# Patient Record
Sex: Female | Born: 1975 | Hispanic: No | Smoking: Never smoker
Health system: Southern US, Community
[De-identification: ages and names within clinical notes are randomized; demographics above are authoritative.]

## PROBLEM LIST (undated history)

## (undated) DIAGNOSIS — O09529 Supervision of elderly multigravida, unspecified trimester: Secondary | ICD-10-CM

## (undated) DIAGNOSIS — Z789 Other specified health status: Secondary | ICD-10-CM

## (undated) HISTORY — DX: Supervision of elderly multigravida, unspecified trimester: O09.529

## (undated) HISTORY — DX: Other specified health status: Z78.9

## (undated) HISTORY — PX: CHOLECYSTECTOMY: SHX55

---

## 1998-09-18 ENCOUNTER — Ambulatory Visit (HOSPITAL_COMMUNITY): Admission: RE | Admit: 1998-09-18 | Discharge: 1998-09-18 | Payer: Self-pay

## 1999-10-01 ENCOUNTER — Emergency Department (HOSPITAL_COMMUNITY): Admission: EM | Admit: 1999-10-01 | Discharge: 1999-10-01 | Payer: Self-pay | Admitting: Emergency Medicine

## 1999-10-02 ENCOUNTER — Encounter: Payer: Self-pay | Admitting: Emergency Medicine

## 1999-10-16 ENCOUNTER — Observation Stay (HOSPITAL_COMMUNITY): Admission: RE | Admit: 1999-10-16 | Discharge: 1999-10-17 | Payer: Self-pay | Admitting: Surgery

## 2001-09-07 ENCOUNTER — Other Ambulatory Visit: Admission: RE | Admit: 2001-09-07 | Discharge: 2001-09-07 | Payer: Self-pay | Admitting: Obstetrics and Gynecology

## 2002-04-16 ENCOUNTER — Inpatient Hospital Stay (HOSPITAL_COMMUNITY): Admission: AD | Admit: 2002-04-16 | Discharge: 2002-04-16 | Payer: Self-pay | Admitting: Obstetrics and Gynecology

## 2002-04-17 ENCOUNTER — Inpatient Hospital Stay (HOSPITAL_COMMUNITY): Admission: AD | Admit: 2002-04-17 | Discharge: 2002-04-19 | Payer: Self-pay | Admitting: Family Medicine

## 2002-06-02 ENCOUNTER — Encounter: Admission: RE | Admit: 2002-06-02 | Discharge: 2002-06-02 | Payer: Self-pay | Admitting: Obstetrics and Gynecology

## 2011-01-28 ENCOUNTER — Other Ambulatory Visit: Payer: Self-pay | Admitting: Emergency Medicine

## 2011-01-28 DIAGNOSIS — N920 Excessive and frequent menstruation with regular cycle: Secondary | ICD-10-CM

## 2011-02-18 ENCOUNTER — Other Ambulatory Visit: Payer: Self-pay

## 2011-02-24 ENCOUNTER — Other Ambulatory Visit: Payer: Self-pay

## 2011-03-03 ENCOUNTER — Ambulatory Visit
Admission: RE | Admit: 2011-03-03 | Discharge: 2011-03-03 | Disposition: A | Discharge status: Home / Self Care | Payer: 59 | Source: Ambulatory Visit | Attending: Emergency Medicine | Admitting: Emergency Medicine

## 2011-03-03 DIAGNOSIS — N920 Excessive and frequent menstruation with regular cycle: Secondary | ICD-10-CM

## 2011-06-21 ENCOUNTER — Ambulatory Visit (INDEPENDENT_AMBULATORY_CARE_PROVIDER_SITE_OTHER): Payer: 59

## 2011-06-21 DIAGNOSIS — N920 Excessive and frequent menstruation with regular cycle: Secondary | ICD-10-CM

## 2011-12-01 ENCOUNTER — Ambulatory Visit (INDEPENDENT_AMBULATORY_CARE_PROVIDER_SITE_OTHER): Payer: 59 | Admitting: Emergency Medicine

## 2011-12-01 VITALS — BP 116/78 | HR 84 | Temp 98.6°F | Resp 17 | Ht 65.0 in | Wt 233.0 lb

## 2011-12-01 DIAGNOSIS — Z01419 Encounter for gynecological examination (general) (routine) without abnormal findings: Secondary | ICD-10-CM

## 2011-12-01 DIAGNOSIS — Z0000 Encounter for general adult medical examination without abnormal findings: Secondary | ICD-10-CM

## 2011-12-01 DIAGNOSIS — J020 Streptococcal pharyngitis: Secondary | ICD-10-CM

## 2011-12-01 MED ORDER — AMOXICILLIN 875 MG PO TABS: 875.0000 mg | ORAL_TABLET | Freq: Two times a day (BID) | ORAL | Status: AC

## 2011-12-01 NOTE — Progress Notes (Signed)
  Subjective:    Patient ID: Kim Yoder, female    DOB: October 12, 1975, 36 y.o.   MRN: [id number]  Sore Throat  This is a new problem. The current episode started yesterday. The problem has been unchanged. Neither side of throat is experiencing more pain than the other. There has been no fever. The pain is at a severity of 2/10. Pertinent negatives include no abdominal pain, congestion, coughing, diarrhea, drooling, ear discharge, ear pain, headaches, hoarse voice, plugged ear sensation, neck pain, shortness of breath, stridor, swollen glands, trouble swallowing or vomiting. She has had exposure to strep. She has tried acetaminophen for the symptoms.      Review of Systems  Constitutional: Positive for activity change, appetite change and fatigue.  HENT: Negative for ear pain, congestion, hoarse voice, drooling, trouble swallowing, neck pain and ear discharge.   Eyes: Negative.   Respiratory: Negative for cough, shortness of breath and stridor.   Cardiovascular: Negative.   Gastrointestinal: Negative.  Negative for vomiting, abdominal pain and diarrhea.  Genitourinary: Negative.   Musculoskeletal: Negative.   Neurological: Negative for headaches.       Objective:   Physical Exam  Constitutional: She is oriented to person, place, and time. She appears well-developed and well-nourished.  HENT:  Head: Normocephalic.  Right Ear: External ear normal.  Left Ear: External ear normal.  Mouth/Throat: Posterior oropharyngeal erythema present.  Eyes: Conjunctivae are normal. Pupils are equal, round, and reactive to light.  Neck: Normal range of motion. Neck supple.  Cardiovascular: Normal rate, regular rhythm and normal heart sounds.   Pulmonary/Chest: Effort normal.  Abdominal: Soft.  Genitourinary: Vagina normal and uterus normal.  Musculoskeletal: Normal range of motion.  Neurological: She is alert and oriented to person, place, and time.  Skin: Skin is warm and dry.           Assessment & Plan:  Treat strep with amox Await PAP

## 2011-12-03 LAB — PAP IG, CT-NG, RFX HPV ASCU
Chlamydia Probe Amp: NEGATIVE
GC Probe Amp: NEGATIVE

## 2012-03-08 LAB — OB RESULTS CONSOLE GC/CHLAMYDIA: Chlamydia: NEGATIVE

## 2012-03-08 LAB — OB RESULTS CONSOLE RPR: RPR: NONREACTIVE

## 2012-06-16 NOTE — L&D Delivery Note (Signed)
Delivery Note At 8:30 PM a viable female was delivered via Vaginal, Spontaneous Delivery (Presentation: ;  ).  APGAR: , ; weight .   Placenta status: , .  Cord:  with the following complications: .  Cord pH: not done  Anesthesia:   Episiotomy:  Lacerations:  Suture Repair: 2.0 Est. Blood Loss (mL):   Mom to postpartum.  Baby to nursery-stable.  Soren Lazarz A 09/12/2012, 8:40 PM

## 2012-08-05 LAB — OB RESULTS CONSOLE GBS: GBS: NEGATIVE

## 2012-09-10 ENCOUNTER — Telehealth (HOSPITAL_COMMUNITY): Payer: Self-pay | Admitting: *Deleted

## 2012-09-10 ENCOUNTER — Encounter (HOSPITAL_COMMUNITY): Payer: Self-pay | Admitting: *Deleted

## 2012-09-10 NOTE — Telephone Encounter (Signed)
Add hiv status

## 2012-09-10 NOTE — Telephone Encounter (Signed)
Preadmission screen Interpreter number [phone]

## 2012-09-12 ENCOUNTER — Encounter (HOSPITAL_COMMUNITY): Payer: Self-pay

## 2012-09-12 ENCOUNTER — Inpatient Hospital Stay (HOSPITAL_COMMUNITY)
Admission: RE | Admit: 2012-09-12 | Discharge: 2012-09-14 | DRG: 775 | Disposition: A | Discharge status: Home / Self Care | Payer: Medicaid Other | Source: Ambulatory Visit | Attending: Obstetrics | Admitting: Obstetrics

## 2012-09-12 DIAGNOSIS — O09529 Supervision of elderly multigravida, unspecified trimester: Principal | ICD-10-CM | POA: Diagnosis present

## 2012-09-12 LAB — CBC
HCT: 38.9 % (ref 36.0–46.0)
Hemoglobin: 14 g/dL (ref 12.0–15.0)
MCH: 32.3 pg (ref 26.0–34.0)
MCHC: 36 g/dL (ref 30.0–36.0)
MCV: 89.6 fL (ref 78.0–100.0)
RBC: 4.34 MIL/uL (ref 3.87–5.11)

## 2012-09-12 LAB — TYPE AND SCREEN

## 2012-09-12 LAB — ABO/RH: ABO/RH(D): O POS

## 2012-09-12 MED ORDER — LACTATED RINGERS IV SOLN: 500.0000 mL | INTRAVENOUS | Status: DC | PRN

## 2012-09-12 MED ORDER — TERBUTALINE SULFATE 1 MG/ML IJ SOLN: 0.2500 mg | Freq: Once | INTRAMUSCULAR | Status: DC | PRN

## 2012-09-12 MED ORDER — LANOLIN HYDROUS EX OINT: TOPICAL_OINTMENT | CUTANEOUS | Status: DC | PRN

## 2012-09-12 MED ORDER — IBUPROFEN 600 MG PO TABS: 600.0000 mg | ORAL_TABLET | Freq: Four times a day (QID) | ORAL | Status: DC | PRN

## 2012-09-12 MED ORDER — OXYTOCIN 40 UNITS IN LACTATED RINGERS INFUSION - SIMPLE MED
62.5000 mL/h | INTRAVENOUS | Status: DC
  Administered 2012-09-12: 500 mL/h via INTRAVENOUS
  Administered 2012-09-12: 62.5 mL/h via INTRAVENOUS

## 2012-09-12 MED ORDER — BUTORPHANOL TARTRATE 1 MG/ML IJ SOLN
1.0000 mg | INTRAMUSCULAR | Status: DC | PRN
  Administered 2012-09-12: 1 mg via INTRAVENOUS
  Filled 2012-09-12: qty 1

## 2012-09-12 MED ORDER — METHYLERGONOVINE MALEATE 0.2 MG/ML IJ SOLN: 0.2000 mg | INTRAMUSCULAR | Status: AC | PRN

## 2012-09-12 MED ORDER — DIPHENHYDRAMINE HCL 25 MG PO CAPS: 25.0000 mg | ORAL_CAPSULE | Freq: Four times a day (QID) | ORAL | Status: DC | PRN

## 2012-09-12 MED ORDER — OXYTOCIN BOLUS FROM INFUSION: 500.0000 mL | INTRAVENOUS | Status: DC

## 2012-09-12 MED ORDER — DIBUCAINE 1 % RE OINT: 1.0000 "application " | TOPICAL_OINTMENT | RECTAL | Status: DC | PRN

## 2012-09-12 MED ORDER — FLEET ENEMA 7-19 GM/118ML RE ENEM: 1.0000 | ENEMA | RECTAL | Status: DC | PRN

## 2012-09-12 MED ORDER — WITCH HAZEL-GLYCERIN EX PADS: 1.0000 "application " | MEDICATED_PAD | CUTANEOUS | Status: DC | PRN

## 2012-09-12 MED ORDER — OXYCODONE-ACETAMINOPHEN 5-325 MG PO TABS: 1.0000 | ORAL_TABLET | ORAL | Status: DC | PRN

## 2012-09-12 MED ORDER — ZOLPIDEM TARTRATE 5 MG PO TABS: 5.0000 mg | ORAL_TABLET | Freq: Every evening | ORAL | Status: DC | PRN

## 2012-09-12 MED ORDER — METHYLERGONOVINE MALEATE 0.2 MG PO TABS
0.2000 mg | ORAL_TABLET | ORAL | Status: AC | PRN
  Administered 2012-09-13 (×6): 0.2 mg via ORAL
  Filled 2012-09-12 (×6): qty 1

## 2012-09-12 MED ORDER — ACETAMINOPHEN 325 MG PO TABS: 650.0000 mg | ORAL_TABLET | ORAL | Status: DC | PRN

## 2012-09-12 MED ORDER — SENNOSIDES-DOCUSATE SODIUM 8.6-50 MG PO TABS
2.0000 | ORAL_TABLET | Freq: Every day | ORAL | Status: DC
  Administered 2012-09-13: 2 via ORAL

## 2012-09-12 MED ORDER — IBUPROFEN 600 MG PO TABS
600.0000 mg | ORAL_TABLET | Freq: Four times a day (QID) | ORAL | Status: DC
  Administered 2012-09-13 – 2012-09-14 (×6): 600 mg via ORAL
  Filled 2012-09-12 (×5): qty 1

## 2012-09-12 MED ORDER — LIDOCAINE HCL (PF) 1 % IJ SOLN
30.0000 mL | INTRAMUSCULAR | Status: DC | PRN
  Filled 2012-09-12 (×2): qty 30

## 2012-09-12 MED ORDER — PRENATAL MULTIVITAMIN CH
1.0000 | ORAL_TABLET | Freq: Every day | ORAL | Status: DC
  Administered 2012-09-13: 1 via ORAL
  Filled 2012-09-12: qty 1

## 2012-09-12 MED ORDER — BENZOCAINE-MENTHOL 20-0.5 % EX AERO: 1.0000 "application " | INHALATION_SPRAY | CUTANEOUS | Status: DC | PRN

## 2012-09-12 MED ORDER — METHYLERGONOVINE MALEATE 0.2 MG/ML IJ SOLN
INTRAMUSCULAR | Status: AC
  Administered 2012-09-12: 0.2 mg
  Filled 2012-09-12: qty 1

## 2012-09-12 MED ORDER — ONDANSETRON HCL 4 MG PO TABS: 4.0000 mg | ORAL_TABLET | ORAL | Status: DC | PRN

## 2012-09-12 MED ORDER — TETANUS-DIPHTH-ACELL PERTUSSIS 5-2.5-18.5 LF-MCG/0.5 IM SUSP
0.5000 mL | Freq: Once | INTRAMUSCULAR | Status: AC
Start: 2012-09-13 — End: 2012-09-13
  Administered 2012-09-13: 0.5 mL via INTRAMUSCULAR
  Filled 2012-09-12: qty 0.5

## 2012-09-12 MED ORDER — CITRIC ACID-SODIUM CITRATE 334-500 MG/5ML PO SOLN: 30.0000 mL | ORAL | Status: DC | PRN

## 2012-09-12 MED ORDER — SIMETHICONE 80 MG PO CHEW: 80.0000 mg | CHEWABLE_TABLET | ORAL | Status: DC | PRN

## 2012-09-12 MED ORDER — FERROUS SULFATE 325 (65 FE) MG PO TABS
325.0000 mg | ORAL_TABLET | Freq: Two times a day (BID) | ORAL | Status: DC
  Administered 2012-09-13 – 2012-09-14 (×3): 325 mg via ORAL
  Filled 2012-09-12 (×3): qty 1

## 2012-09-12 MED ORDER — ONDANSETRON HCL 4 MG/2ML IJ SOLN: 4.0000 mg | Freq: Four times a day (QID) | INTRAMUSCULAR | Status: DC | PRN

## 2012-09-12 MED ORDER — OXYTOCIN 40 UNITS IN LACTATED RINGERS INFUSION - SIMPLE MED
1.0000 m[IU]/min | INTRAVENOUS | Status: DC
  Administered 2012-09-12: 2 m[IU]/min via INTRAVENOUS
  Filled 2012-09-12: qty 1000

## 2012-09-12 MED ORDER — ONDANSETRON HCL 4 MG/2ML IJ SOLN: 4.0000 mg | INTRAMUSCULAR | Status: DC | PRN

## 2012-09-12 MED ORDER — LACTATED RINGERS IV SOLN
INTRAVENOUS | Status: DC
  Administered 2012-09-12 (×2): via INTRAVENOUS

## 2012-09-12 NOTE — H&P (Signed)
This is Dr. Francoise Ceo dictating the history and physical on blank blank she's a 37 year old gravida 3 para 2002 at 40 weeks and 1 day her EDC is 09/11/2012 and she desired induction negative GBS cervix 1 cm 50% vertex -3 and should has occasional contractions and would been started on low-dose Pitocin Past medical history negative Past surgical history negative Social history negative System review negative Physical exam well-developed female in no distress HEENT negative Lungs clear to P&A Heart regular rhythm no murmurs no gallops Breasts negative Abdomen term Pelvic as described above Extremities negative and

## 2012-09-12 NOTE — Progress Notes (Signed)
Pt bleeding heavily.  Fundus massaged and Dr. Gaynell Face called to come to room. Vaginal exploration done by Dr. Gaynell Face.  Multiple clots expelled. Methergine given, see MAR.  Dr. Gaynell Face states blood loss not above 500cc

## 2012-09-12 NOTE — Progress Notes (Signed)
Patient ID: Kim Yoder, female   DOB: 03/26/1976, 37 y.o.   MRN: [id number] Cervix no 3 cm 70% vertex -3 amniotomy performed the fluids clear she is contracting every 2-3 minutes reactive tracing

## 2012-09-13 ENCOUNTER — Encounter (HOSPITAL_COMMUNITY): Payer: Self-pay

## 2012-09-13 LAB — CBC
HCT: 35.3 % — ABNORMAL LOW (ref 36.0–46.0)
Hemoglobin: 12.3 g/dL (ref 12.0–15.0)
MCHC: 34.8 g/dL (ref 30.0–36.0)

## 2012-09-13 NOTE — Progress Notes (Signed)
UR completed 

## 2012-09-13 NOTE — Progress Notes (Signed)
Patient ID: Kim Yoder, female   DOB: 06-06-1976, 37 y.o.   MRN: [id number] Postpartum day one Vital signs normal Fundus firm Lochia moderate Doing well

## 2012-09-14 NOTE — Discharge Summary (Signed)
Obstetric Discharge Summary Reason for Admission: induction of labor Prenatal Procedures: none Intrapartum Procedures: spontaneous vaginal delivery Postpartum Procedures: none Complications-Operative and Postpartum: none Hemoglobin  Date Value Range Status  09/13/2012 12.3  12.0 - 15.0 g/dL Final     HCT  Date Value Range Status  09/13/2012 35.3* 36.0 - 46.0 % Final    Physical Exam:  General: alert Lochia: appropriate Uterine Fundus: firm Incision: healing well DVT Evaluation: No evidence of DVT seen on physical exam.  Discharge Diagnoses: Term Pregnancy-delivered  Discharge Information: Date: 09/14/2012 Activity: pelvic rest Diet: routine Medications: Percocet Condition: stable Instructions: refer to practice specific booklet Discharge to: home Follow-up Information   Follow up with MARSHALL,BERNARD A, MD. Call in 6 weeks.   Contact information:   [address] [place] [address] [phone]       Newborn Data: Live born female  Birth Weight: 7 lb 15.7 oz (3620 g) APGAR: 8, 9  Home with mother.  MARSHALL,BERNARD A 09/14/2012, 6:41 AM

## 2012-10-11 IMAGING — US US PELVIS COMPLETE
1 series · 14 of 25 positions shown · non-contrast
Comparison: None.

CLINICAL DATA: Heavy menstrual bleeding.

TRANSABDOMINAL AND TRANSVAGINAL ULTRASOUND OF PELVIS
TECHNIQUE: Both transabdominal and transvaginal ultrasound
examinations of the pelvis were performed. Transabdominal technique
was performed for global imaging of the pelvis including uterus,
ovaries, adnexal regions, and pelvic cul-de-sac.

[Series 1: us pelvis complete · 0.30mm/px · 14 of 39 slices shown]
[im 1/39]
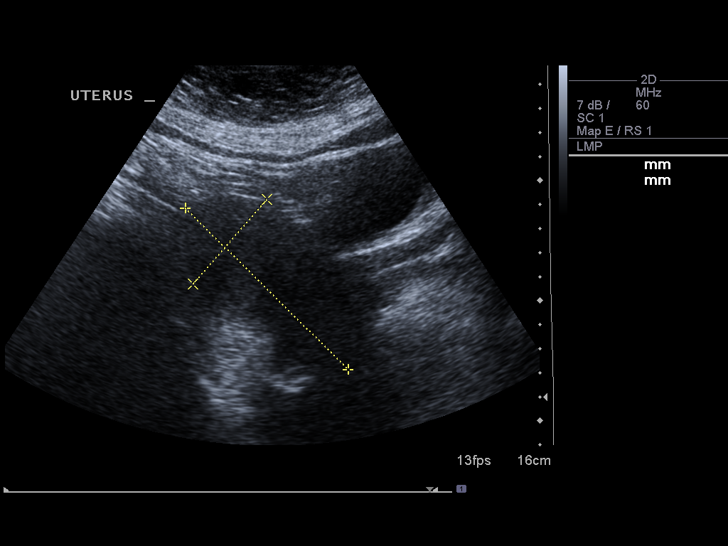
[im 4/39]
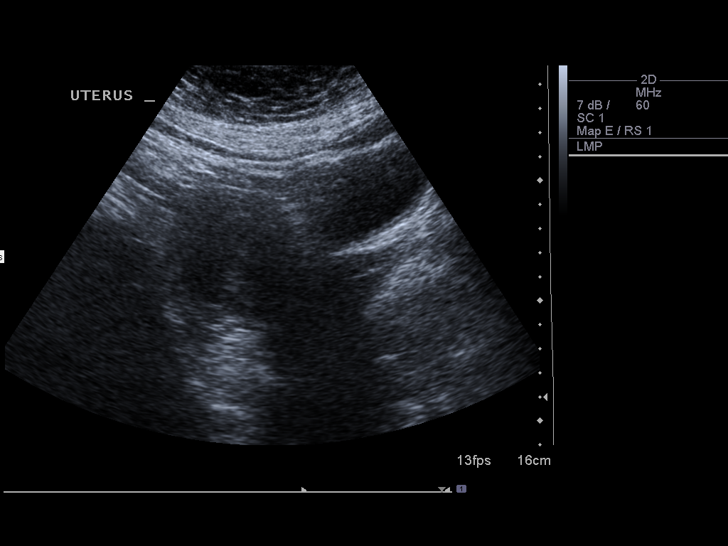
[im 7/39]
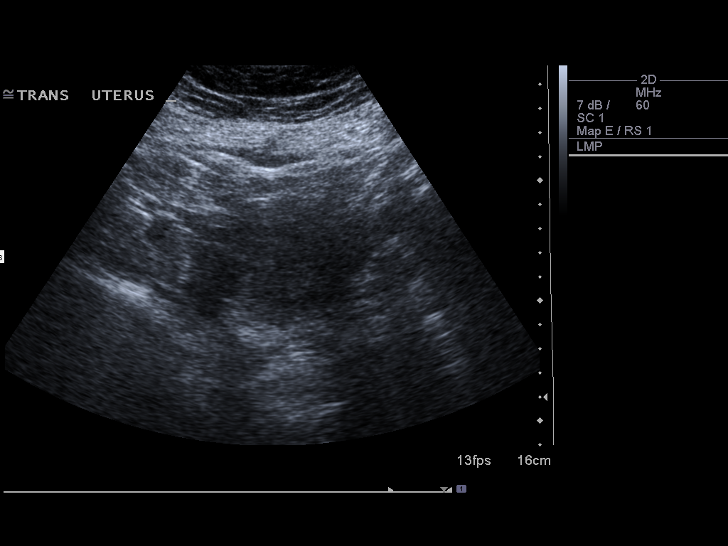
[im 10/39]
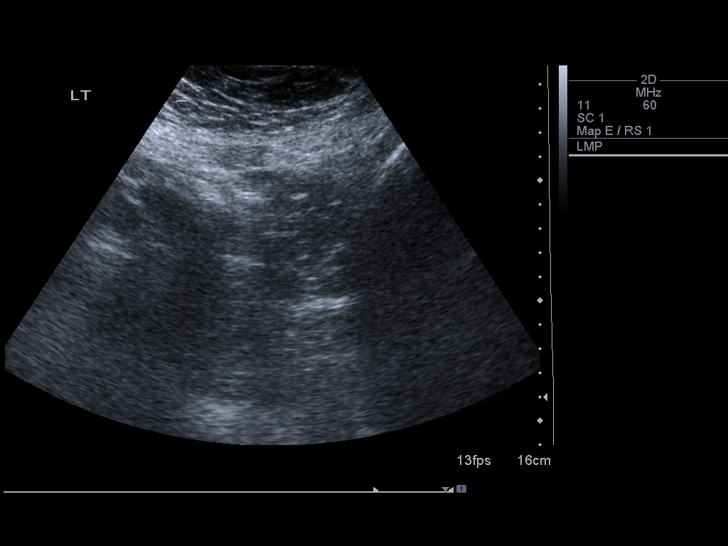
[im 13/39]
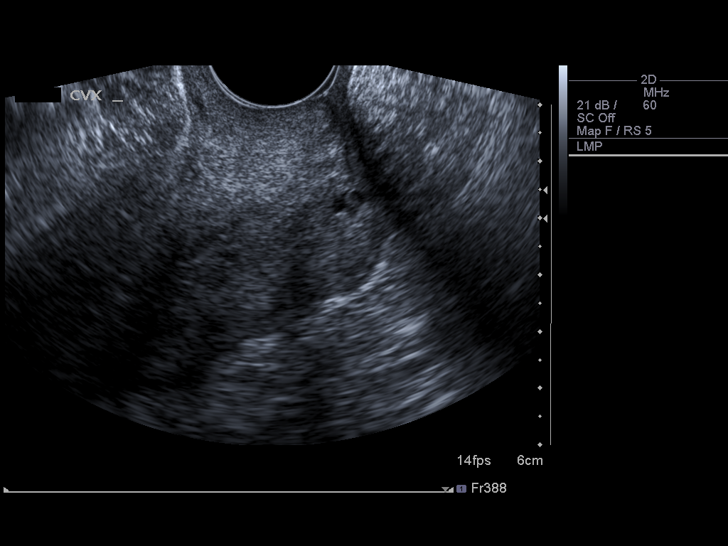
[im 15/39]
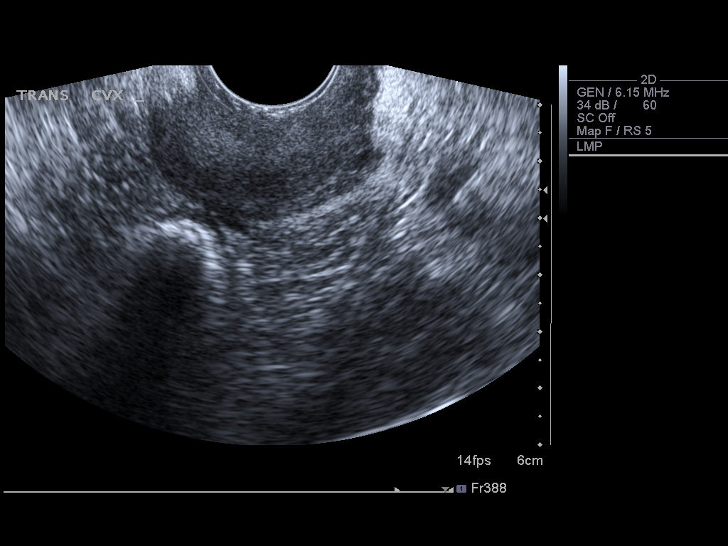
[im 18/39]
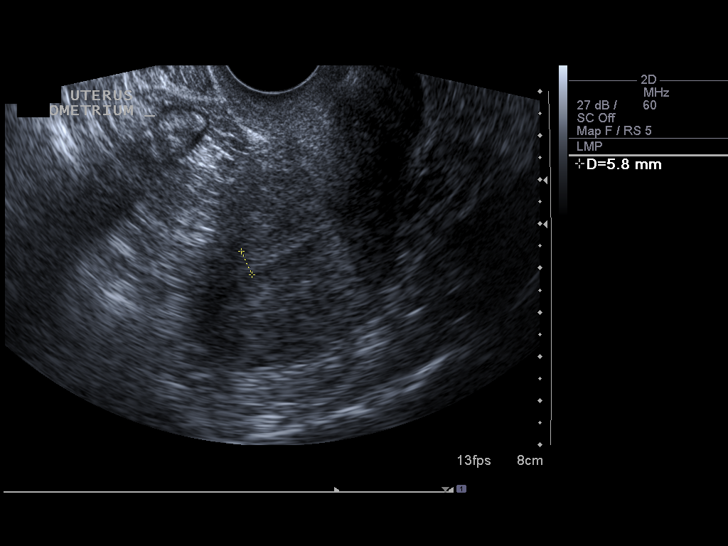
[im 21/39]
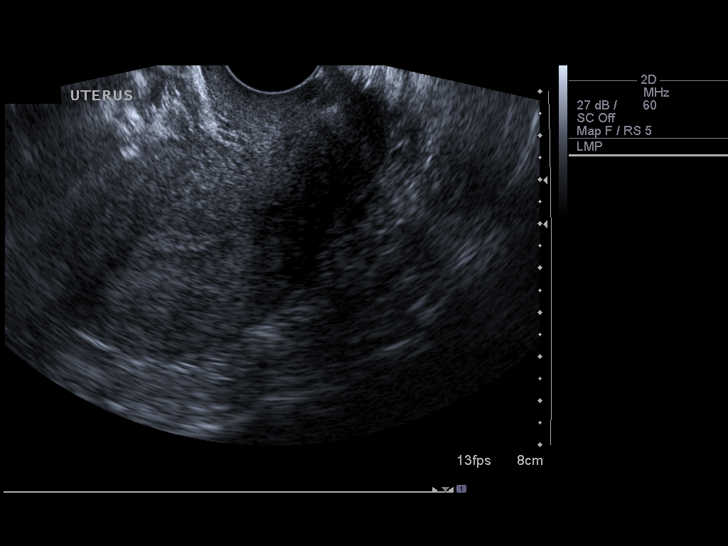
[im 24/39]
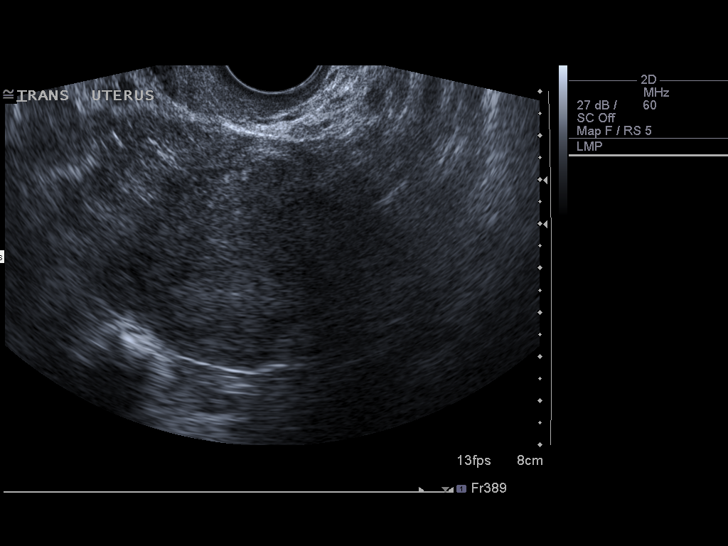
[im 26/39]
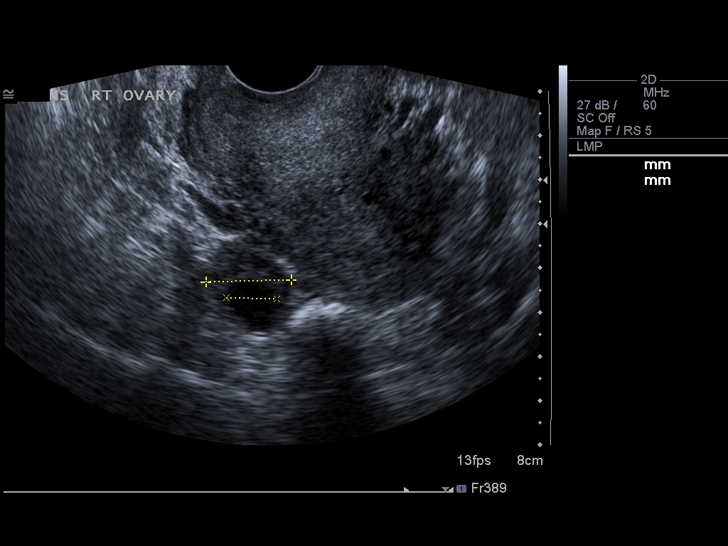
[im 29/39]
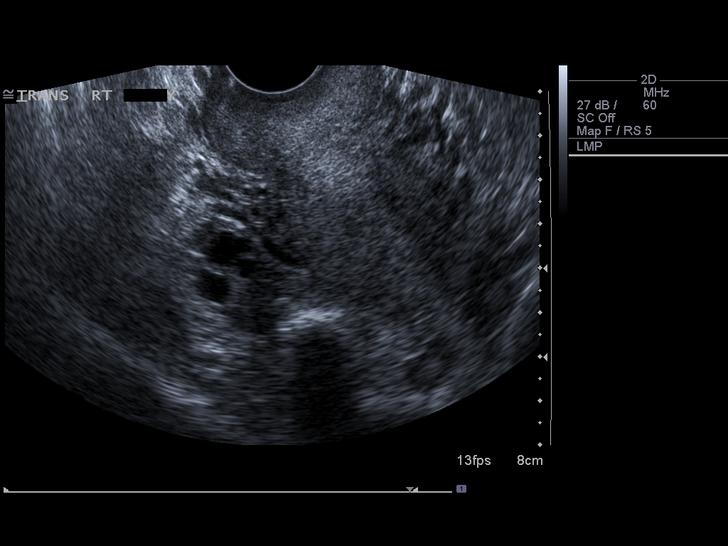
[im 32/39]
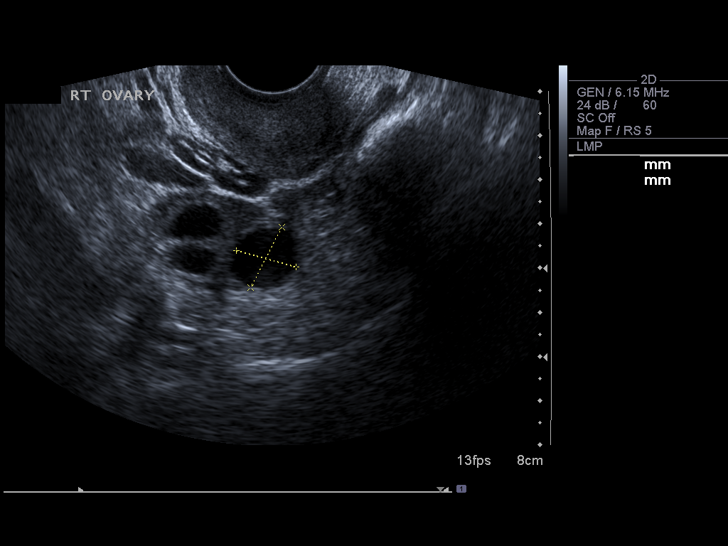
[im 35/39]
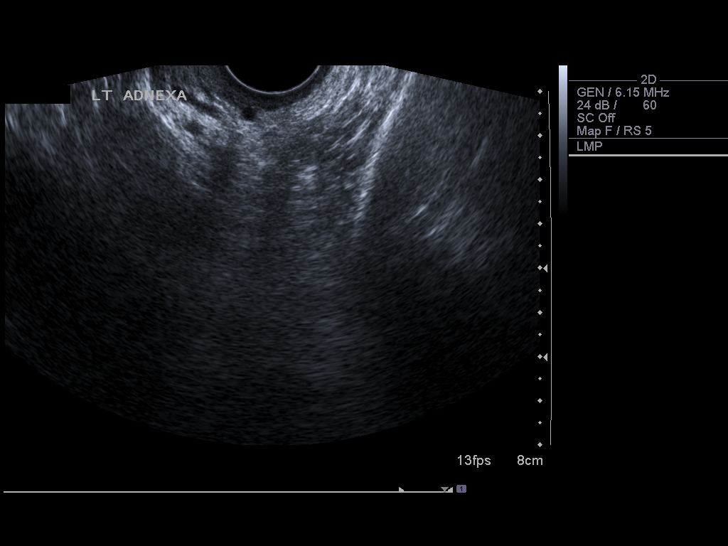
[im 39/39]
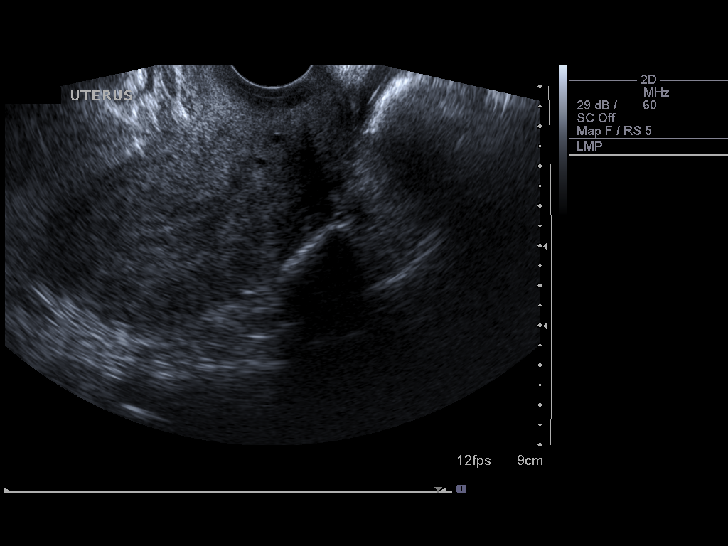

[14 of 25 positions shown; findings below may reference images not displayed]

It was necessary to proceed with endovaginal exam following the
transabdominal exam to visualize the ovaries and adnexa.
FINDINGS: Uterus: Normal in size and appearance

Endometrium: Normal, 6 mm.

Right ovary:  2.6 x 2.2 x 1.9 cm. Normal in morphology.

Left ovary: Not visualized secondary bowel gas.  No adnexal mass
identified.

Other findings: No free fluid
IMPRESSION: No explanation for heavy menstrual bleeding.

Lack of visualization of the left ovary.

## 2014-04-17 ENCOUNTER — Encounter (HOSPITAL_COMMUNITY): Payer: Self-pay

## 2014-10-30 ENCOUNTER — Ambulatory Visit (INDEPENDENT_AMBULATORY_CARE_PROVIDER_SITE_OTHER): Payer: BLUE CROSS/BLUE SHIELD | Admitting: Physician Assistant

## 2014-10-30 VITALS — BP 120/82 | HR 86 | Temp 98.2°F | Resp 16 | Ht 63.0 in | Wt 237.2 lb

## 2014-10-30 DIAGNOSIS — N912 Amenorrhea, unspecified: Secondary | ICD-10-CM | POA: Diagnosis not present

## 2014-10-30 LAB — POCT URINE PREGNANCY: PREG TEST UR: NEGATIVE

## 2014-10-30 NOTE — Progress Notes (Signed)
   Subjective:    Patient ID: Kim Yoder, female    DOB: [birth year], 39 y.o.   MRN: [id number]  HPI Patient presents for amenorrhea for past 5 months. Denies any spotting since last menstrual cycle 05/23/14. Had isolated episode of lower abdominal cramping last week and she thought her cycle was going to start, but it did not. Ever since that episode has felt bloated, but denies nausea, vomiting, diarrhea, constipation, fever, urinary sx, or vaginal discharge/pain. Mother went through menopause at the age of 42 and unsure if sister has similar sx. Is sexually active with husband of 24 years and use condoms sometimes, but is not on any other method of birth control. Has two year old that had several negative home preg test so was unaware she was pregnant. Has four children total and all were vaginal deliveries. Denies vaginal procedures and has only had cholecystectomy. Denies changes in hair/skin textures. Has lost 18 lbs intentionally through diet and exercise. No family hx of thyroid dz. Has had chills on occasion at night, but denies sweating or hot flashes. Denies STI hx.    Review of Systems  Constitutional: Positive for chills (occasional). Negative for diaphoresis and fatigue.  Gastrointestinal: Negative for nausea, vomiting, abdominal pain, diarrhea and constipation.  Endocrine: Negative.   Genitourinary: Negative.   Skin: Negative.   Neurological: Negative.        Objective:   Physical Exam  Constitutional: She is oriented to person, place, and time. She appears well-developed and well-nourished. No distress.  Blood pressure 120/82, pulse 86, temperature 98.2 F (36.8 C), temperature source Oral, resp. rate 16, height 5\' 3"  (1.6 m), weight 237 lb 3.2 oz (107.593 kg), last menstrual period 05/23/2014, SpO2 98 %, unknown if currently breastfeeding.  HENT:  Head: Normocephalic and atraumatic.  Right Ear: External ear normal.  Left Ear: External ear normal.  Eyes:  Conjunctivae are normal. Pupils are equal, round, and reactive to light. Right eye exhibits no discharge. Left eye exhibits no discharge. No scleral icterus.  Neck: Normal range of motion. Neck supple. No thyromegaly present.  Cardiovascular: Normal rate, regular rhythm and normal heart sounds.  Exam reveals no gallop and no friction rub.   No murmur heard. Pulmonary/Chest: Effort normal and breath sounds normal. No respiratory distress. She has no wheezes. She has no rales.  Abdominal: Soft. Bowel sounds are normal. She exhibits no distension. There is no tenderness. There is no rebound and no guarding.  Lymphadenopathy:    She has no cervical adenopathy.  Neurological: She is alert and oriented to person, place, and time.  Skin: Skin is warm and dry. No rash noted. She is not diaphoretic. No erythema. No pallor.   Results for orders placed or performed in visit on 10/30/14  POCT urine pregnancy  Result Value Ref Range   Preg Test, Ur Negative       Assessment & Plan:  1. Amenorrhea Results pending if both HCG and TSH, negative will refer to OB/GYN for further evaluation. Patient prefers Spanish speaking provider.  - POCT urine pregnancy - hCG, quantitative, pregnancy - TSH   Angely Dietz PA-C  [facility] 10/30/2014 7:20 PM

## 2014-10-31 ENCOUNTER — Encounter: Payer: Self-pay | Admitting: Physician Assistant

## 2014-10-31 ENCOUNTER — Telehealth: Payer: Self-pay | Admitting: Physician Assistant

## 2014-10-31 DIAGNOSIS — N912 Amenorrhea, unspecified: Secondary | ICD-10-CM

## 2014-10-31 LAB — HCG, QUANTITATIVE, PREGNANCY

## 2014-10-31 LAB — TSH: TSH: 0.898 u[IU]/mL (ref 0.350–4.500)

## 2014-10-31 NOTE — Progress Notes (Signed)
Discussed with PA Tishira Brewington.  40 yo with new onset amenorrhea x 5 months.  No menopausal prodromal symptoms.  Unclear family history of early menopause.  Last child was 2 years ago. Negative urine pregnancy.  TSH and serum HCG (low liklihood of this being positive) drawn. Referred to GYN.

## 2014-10-31 NOTE — Telephone Encounter (Signed)
Left vmail. Labs are neg referral sent to ob/gyn.

## 2016-04-15 ENCOUNTER — Encounter: Payer: Self-pay | Admitting: Obstetrics

## 2020-06-02 ENCOUNTER — Telehealth: Payer: Self-pay | Admitting: Family

## 2020-06-02 ENCOUNTER — Other Ambulatory Visit: Payer: Self-pay | Admitting: Family

## 2020-06-02 DIAGNOSIS — E669 Obesity, unspecified: Secondary | ICD-10-CM

## 2020-06-02 DIAGNOSIS — U071 COVID-19: Secondary | ICD-10-CM

## 2020-06-02 DIAGNOSIS — C499 Malignant neoplasm of connective and soft tissue, unspecified: Secondary | ICD-10-CM

## 2020-06-02 NOTE — Telephone Encounter (Signed)
Called to Discuss with patient about Covid symptoms and the use of the monoclonal antibody infusion for those with mild to moderate Covid symptoms and at a high risk of hospitalization.     Pt appears to qualify for this infusion due to co-morbid conditions and/or a member of an at-risk group in accordance with the FDA Emergency Use Authorization.    Kim Yoder speaks primarily [place] and a medical interpreter was used to aid in communication. Referred by her PCP with symptom onset of 12/16 and positive test on 12/17. Currently experiencing fatiguebodyaches, cough, congestion and has been without fever. Occasional shortness of breath. 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   Discussed the risks, benefits and potential financial associated with treatment with monoclonal antibodies and wishes to continue with treatment.   [name],   We have contacted you because you have recently been diagnosed with COVID-19 with active symptoms of less than 10 days in duration and may benefit from a new treatment for mild to moderate disease. This treatment has been shown to reduce the risk of hospitalization and decrease the risk for severe symptoms related to COVID-19 in a select group of patients with risk factors / medical conditions we believe put people at a higher risk for this infection.    The Food and Drug Administration (FDA) has given emergency use of a new medication to treat those with mild to moderate symptoms who have risk factors that have been identified for severe symptoms related to COVID-19. This new treatment is a monoclonal antibody - the way it works its that it attaches like a magnet to the SARS-CoV2 virus (the virus that causes COVID-19) and stops it from infecting more cells in your body. It does not kill the virus but it prevents it from spreading throughout your body with the hope that it will decrease your symptoms after it is  given.    This new medication is an intravenous (IV) infusion that is given over one half hour in our outpatient infusion clinic here in [place]. We will require you to stay roughly 60 minutes after the infusion to ensure you are tolerating it and watch for any allergic reaction to the medication. More information will also be given to you at the time of your appointment. There are three  different medications. The medications are called Bamlanivimab & Etesevimab (by Waynetta Sandy) or [place] (by [employer]) or Sotrovimab.   The side effects that have been seen with this treatment:  2-4% of recipients experience nausea, vomiting, diarrhea, dizziness, headaches, itching, worsening fevers or chills for ~24 hours.  There have been no serious infusion related reactions  Of the over 3000 patients who received the infusion one did have an allergic response that resolved once the infusion was stopped - this is why we monitor all of our patients closely for an hour after the infusion.   The covid 19 vaccine (including boosters) must be delayed at least 90 days after receiving this infusion.   The medication itself is free, but your insurance will be charged an infusion fee. The amount you may owe later varies from insurance to insurance. If you do not have insurance, we can put you in touch with our billing department. The CMS codes are:  [id number] or [phone] or [phone]   You have been scheduled to receive the monoclonal antibody therapy at [place]: 06/04/20 at 12:30 pm  If you have been tested outside of a [facility] - you MUST bring a copy of your positive test with you the morning of your appointment. You may take a photo of this and upload to your MyChart portal or have the testing facility fax the result to [phone]    The address for the infusion clinic site is:  --GPS address is [place] - the parking is located near [employer] building where you will see   COVID19 Infusion feather banner marking the entrance to parking.   (see photos below)            --Enter into the 2nd entrance where the "wave, flag banner" is at the road. Turn into this 2nd entrance and immediately turn left to park in 1 of the 5 parking spots.   --Please stay in your car and call the desk for assistance inside [phone].   --Average time in department is roughly 90 minutes to 2 hours for monoclonal treatment - this includes preparation of the medication, IV start and the required 1 hour monitoring after the infusion.    Should you develop worsening shortness of breath, chest pain or severe breathing problems please do not wait for this appointment and go to the Emergency room for evaluation and treatment. You will undergo another oxygen screen before your infusion to ensure this is the best treatment option for you. There is a chance that the best decision may be to send you to the Emergency Room for evaluation at the time of your appointment.   The day of your visit you should: Marland Kitchen Get plenty of rest the night before and drink plenty of water . Eat a light meal/snack before coming and take your medications as prescribed  . Wear warm, comfortable clothes with a shirt that can roll-up over the elbow (will need IV start).  . Wear a mask  . Consider bringing some activity to help pass the time   The medication itself is free, paid for by government COVID funding relief, and many commercial insurers are waiving bills related to COVID treatment, however some have sent bills to patients later for the administration fees related to the medication.  Please contact your insurance agent to discuss prior to your appointment if you would like further details about billing specific to your policy.    Billing codes are: M7680 or [phone] or [phone]   I hope this helps find you feeling better,   Terri Piedra, NP 06/02/2020 5:38 PM

## 2020-06-02 NOTE — Progress Notes (Signed)
I connected by phone with Kim Yoder on 06/02/2020 at 5:44 PM to discuss the potential use of a new treatment for mild to moderate COVID-19 viral infection in non-hospitalized patients.  This patient is a 43 y.o. female that meets the FDA criteria for Emergency Use Authorization of COVID monoclonal antibody casirivimab/imdevimab, bamlanivimab/etesevimab, or sotrovimab.  Has a (+) direct SARS-CoV-2 viral test result  Has mild or moderate COVID-19   Is NOT hospitalized due to COVID-19  Is within 10 days of symptom onset  Has at least one of the high risk factor(s) for progression to severe COVID-19 and/or hospitalization as defined in EUA.  Specific high risk criteria : BMI > 25 and Immunosuppressive Disease or Treatment   I have spoken and communicated the following to the patient or parent/caregiver regarding COVID monoclonal antibody treatment:  1. FDA has authorized the emergency use for the treatment of mild to moderate COVID-19 in adults and pediatric patients with positive results of direct SARS-CoV-2 viral testing who are 100 years of age and older weighing at least 40 kg, and who are at high risk for progressing to severe COVID-19 and/or hospitalization.  2. The significant known and potential risks and benefits of COVID monoclonal antibody, and the extent to which such potential risks and benefits are unknown.  3. Information on available alternative treatments and the risks and benefits of those alternatives, including clinical trials.  4. Patients treated with COVID monoclonal antibody should continue to self-isolate and use infection control measures (e.g., wear mask, isolate, social distance, avoid sharing personal items, clean and disinfect "high touch" surfaces, and frequent handwashing) according to CDC guidelines.   5. The patient or parent/caregiver has the option to accept or refuse COVID monoclonal antibody treatment.  After reviewing this information with  the patient, the patient has agreed to receive one of the available covid 19 monoclonal antibodies and will be provided an appropriate fact sheet prior to infusion. Mauricio Po, FNP 06/02/2020 5:44 PM

## 2020-06-04 ENCOUNTER — Ambulatory Visit (HOSPITAL_COMMUNITY)
Admission: RE | Admit: 2020-06-04 | Discharge: 2020-06-04 | Disposition: A | Discharge status: Home / Self Care | Payer: Managed Care, Other (non HMO) | Source: Ambulatory Visit | Attending: Pulmonary Disease | Admitting: Pulmonary Disease

## 2020-06-04 DIAGNOSIS — E669 Obesity, unspecified: Secondary | ICD-10-CM | POA: Diagnosis present

## 2020-06-04 DIAGNOSIS — C499 Malignant neoplasm of connective and soft tissue, unspecified: Secondary | ICD-10-CM | POA: Diagnosis not present

## 2020-06-04 DIAGNOSIS — U071 COVID-19: Secondary | ICD-10-CM | POA: Diagnosis present

## 2020-06-04 MED ORDER — EPINEPHRINE 0.3 MG/0.3ML IJ SOAJ: 0.3000 mg | Freq: Once | INTRAMUSCULAR | Status: DC | PRN

## 2020-06-04 MED ORDER — METHYLPREDNISOLONE SODIUM SUCC 125 MG IJ SOLR: 125.0000 mg | Freq: Once | INTRAMUSCULAR | Status: DC | PRN

## 2020-06-04 MED ORDER — DIPHENHYDRAMINE HCL 50 MG/ML IJ SOLN: 50.0000 mg | Freq: Once | INTRAMUSCULAR | Status: DC | PRN

## 2020-06-04 MED ORDER — SODIUM CHLORIDE 0.9 % IV SOLN: Freq: Once | INTRAVENOUS | Status: AC

## 2020-06-04 MED ORDER — SODIUM CHLORIDE 0.9 % IV SOLN: INTRAVENOUS | Status: DC | PRN

## 2020-06-04 MED ORDER — FAMOTIDINE IN NACL 20-0.9 MG/50ML-% IV SOLN: 20.0000 mg | Freq: Once | INTRAVENOUS | Status: DC | PRN

## 2020-06-04 MED ORDER — ALBUTEROL SULFATE HFA 108 (90 BASE) MCG/ACT IN AERS: 2.0000 | INHALATION_SPRAY | Freq: Once | RESPIRATORY_TRACT | Status: DC | PRN

## 2020-06-04 NOTE — Discharge Instructions (Signed)
10 Things You Can Do to Manage Your COVID-19 Symptoms at Home If you have possible or confirmed COVID-19: 1. Stay home from work and school. And stay away from other public places. If you must go out, avoid using any kind of public transportation, ridesharing, or taxis. 2. Monitor your symptoms carefully. If your symptoms get worse, call your healthcare provider immediately. 3. Get rest and stay hydrated. 4. If you have a medical appointment, call the healthcare provider ahead of time and tell them that you have or may have COVID-19. 5. For medical emergencies, call 911 and notify the dispatch personnel that you have or may have COVID-19. 6. Cover your cough and sneezes with a tissue or use the inside of your elbow. 7. Wash your hands often with soap and water for at least 20 seconds or clean your hands with an alcohol-based hand sanitizer that contains at least 60% alcohol. 8. As much as possible, stay in a specific room and away from other people in your home. Also, you should use a separate bathroom, if available. If you need to be around other people in or outside of the home, wear a mask. 9. Avoid sharing personal items with other people in your household, like dishes, towels, and bedding. 10. Clean all surfaces that are touched often, like counters, tabletops, and doorknobs. Use household cleaning sprays or wipes according to the label instructions. cdc.gov/coronavirus 12/15/2018 This information is not intended to replace advice given to you by your health care provider. Make sure you discuss any questions you have with your health care provider. Document Revised: 05/19/2019 Document Reviewed: 05/19/2019 Elsevier Patient Education  2020 Elsevier Inc. What types of side effects do monoclonal antibody drugs cause?  Common side effects  In general, the more common side effects caused by monoclonal antibody drugs include: . Allergic reactions, such as hives or itching . Flu-like signs and  symptoms, including chills, fatigue, fever, and muscle aches and pains . Nausea, vomiting . Diarrhea . Skin rashes . Low blood pressure   The CDC is recommending patients who receive monoclonal antibody treatments wait at least 90 days before being vaccinated.  Currently, there are no data on the safety and efficacy of mRNA COVID-19 vaccines in persons who received monoclonal antibodies or convalescent plasma as part of COVID-19 treatment. Based on the estimated half-life of such therapies as well as evidence suggesting that reinfection is uncommon in the 90 days after initial infection, vaccination should be deferred for at least 90 days, as a precautionary measure until additional information becomes available, to avoid interference of the antibody treatment with vaccine-induced immune responses. If you have any questions or concerns after the infusion please call the Advanced Practice Provider on call at [phone]. This number is ONLY intended for your use regarding questions or concerns about the infusion post-treatment side-effects.  Please do not provide this number to others for use. For return to work notes please contact your primary care provider.   If someone you know is interested in receiving treatment please have them call the COVID hotline at [phone].   

## 2020-06-04 NOTE — Progress Notes (Signed)
  Diagnosis: COVID-19  Physician:Dr. Patrick Wright  Procedure: Covid Infusion Clinic Med: bamlanivimab\etesevimab infusion - Provided patient with bamlanimivab\etesevimab fact sheet for patients, parents and caregivers prior to infusion.  Complications: No immediate complications noted.  Discharge: Discharged home   Kim Yoder 06/04/2020   

## 2020-06-04 NOTE — Progress Notes (Signed)
Patient reviewed Fact Sheet for Patients, Parents, and Caregivers for Emergency Use Authorization (EUA) of bamlanivimab and etesevimab for the Treatment of Coronavirus. Patient also reviewed and is agreeable to the estimated cost of treatment. Patient is agreeable to proceed.   

## 2021-05-06 ENCOUNTER — Encounter: Payer: Managed Care, Other (non HMO) | Attending: Medical | Admitting: Registered"
